# Patient Record
Sex: Female | Born: 1965 | Race: White | Hispanic: No | Marital: Married | Smoking: Never smoker
Health system: Southern US, Community
[De-identification: ages and names within clinical notes are randomized; demographics above are authoritative.]

## PROBLEM LIST (undated history)

## (undated) DIAGNOSIS — I10 Essential (primary) hypertension: Secondary | ICD-10-CM

---

## 2006-10-06 ENCOUNTER — Ambulatory Visit: Payer: Self-pay | Admitting: Obstetrics and Gynecology

## 2008-03-15 ENCOUNTER — Ambulatory Visit: Payer: Self-pay

## 2009-05-09 ENCOUNTER — Ambulatory Visit: Payer: Self-pay

## 2010-05-22 ENCOUNTER — Ambulatory Visit: Payer: Self-pay | Admitting: Family Medicine

## 2011-12-24 ENCOUNTER — Ambulatory Visit: Payer: Self-pay | Admitting: Family Medicine

## 2012-01-03 ENCOUNTER — Ambulatory Visit: Payer: Self-pay | Admitting: Family Medicine

## 2012-07-21 ENCOUNTER — Ambulatory Visit: Payer: Self-pay | Admitting: Family Medicine

## 2015-02-20 ENCOUNTER — Ambulatory Visit
Admission: RE | Admit: 2015-02-20 | Discharge: 2015-02-20 | Disposition: A | Discharge status: Home / Self Care | Payer: BLUE CROSS/BLUE SHIELD | Source: Ambulatory Visit | Attending: Family Medicine | Admitting: Family Medicine

## 2015-02-20 ENCOUNTER — Other Ambulatory Visit: Payer: Self-pay | Admitting: Family Medicine

## 2015-02-20 DIAGNOSIS — X58XXXA Exposure to other specified factors, initial encounter: Secondary | ICD-10-CM | POA: Diagnosis not present

## 2015-02-20 DIAGNOSIS — S93421A Sprain of deltoid ligament of right ankle, initial encounter: Secondary | ICD-10-CM | POA: Diagnosis not present

## 2015-02-20 DIAGNOSIS — S93401A Sprain of unspecified ligament of right ankle, initial encounter: Secondary | ICD-10-CM

## 2015-09-16 ENCOUNTER — Other Ambulatory Visit: Payer: Self-pay | Admitting: Family Medicine

## 2015-09-16 DIAGNOSIS — Z1231 Encounter for screening mammogram for malignant neoplasm of breast: Secondary | ICD-10-CM

## 2015-09-19 ENCOUNTER — Ambulatory Visit
Admission: RE | Admit: 2015-09-19 | Discharge: 2015-09-19 | Disposition: A | Discharge status: Home / Self Care | Payer: BLUE CROSS/BLUE SHIELD | Source: Ambulatory Visit | Attending: Family Medicine | Admitting: Family Medicine

## 2015-09-19 DIAGNOSIS — Z1231 Encounter for screening mammogram for malignant neoplasm of breast: Secondary | ICD-10-CM | POA: Diagnosis present

## 2015-12-09 IMAGING — CR DG ANKLE COMPLETE 3+V*R*
4 series · 4 of 4 positions shown · non-contrast
Comparison: None.

CLINICAL DATA: Injury.  Initial evaluation .

EXAM:
RIGHT ANKLE - COMPLETE 3+ VIEW

[ankle ap]
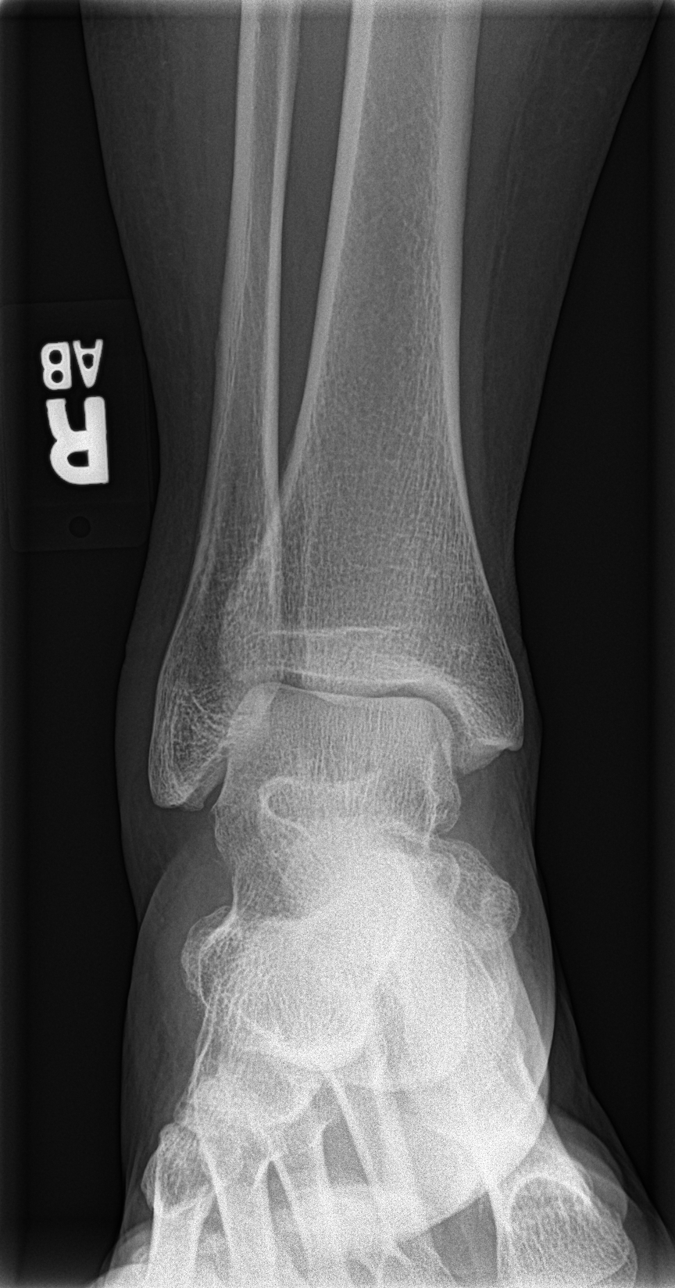

[ankle obl]
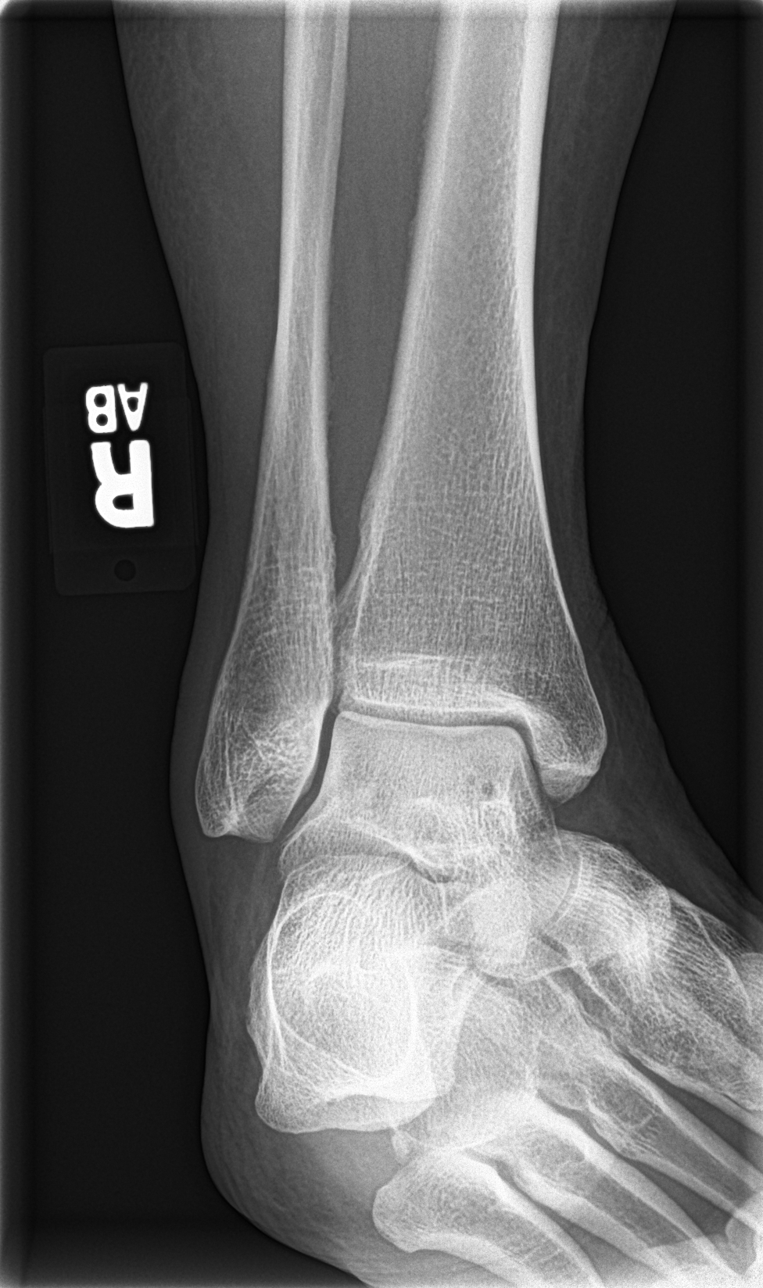

[ankle lat (1 of 2)]
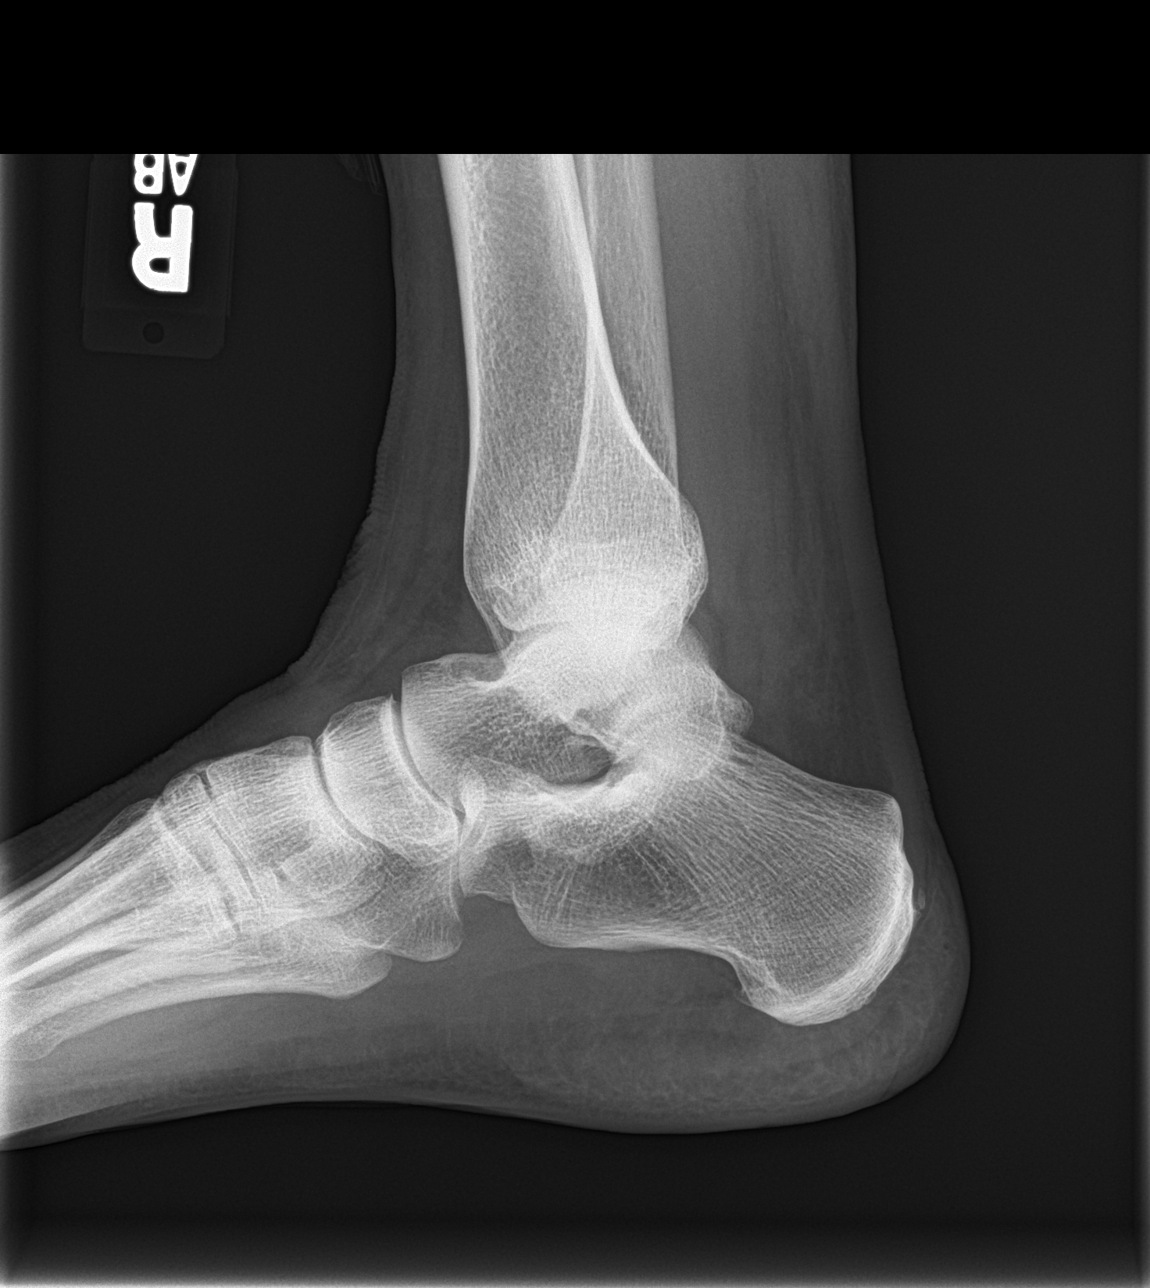

[ankle lat (2 of 2)]
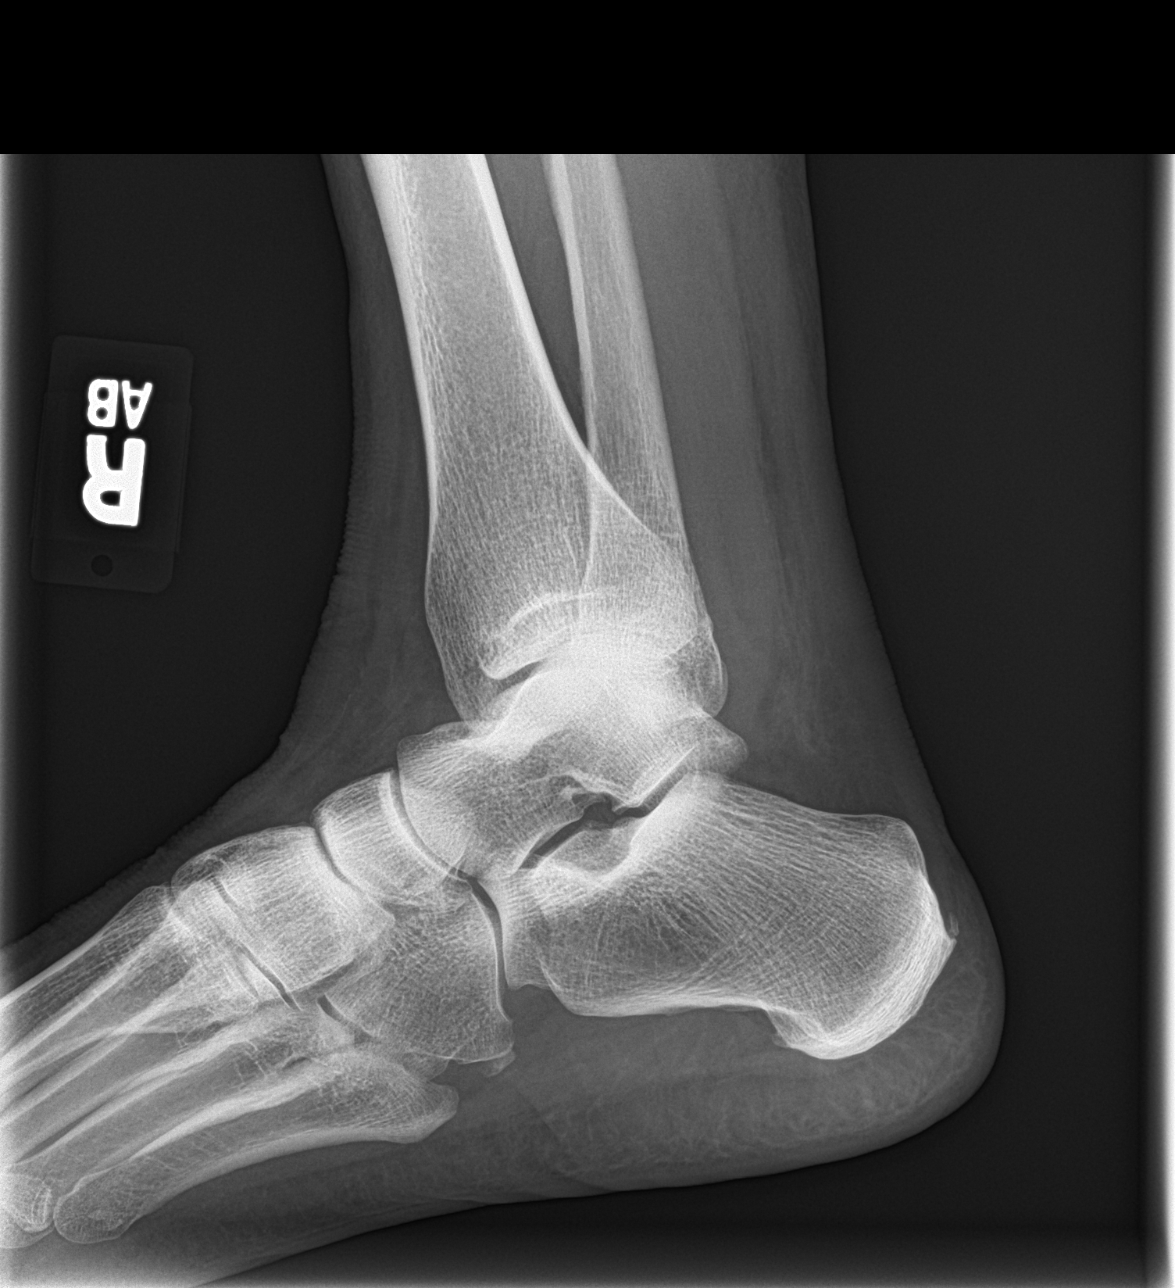

[4 of 4 positions shown; findings below may reference images not displayed]

FINDINGS: Mild soft tissue swelling. No acute bony or joint abnormality. No
evidence of fracture or dislocation.
IMPRESSION: Mild soft tissue swelling.  Otherwise negative exam.

## 2016-03-05 ENCOUNTER — Encounter: Payer: Self-pay | Admitting: Emergency Medicine

## 2016-03-05 ENCOUNTER — Emergency Department
Admission: EM | Admit: 2016-03-05 | Discharge: 2016-03-05 | Disposition: A | Discharge status: Home / Self Care | Payer: PRIVATE HEALTH INSURANCE | Attending: Emergency Medicine | Admitting: Emergency Medicine

## 2016-03-05 DIAGNOSIS — Y9389 Activity, other specified: Secondary | ICD-10-CM | POA: Insufficient documentation

## 2016-03-05 DIAGNOSIS — Y999 Unspecified external cause status: Secondary | ICD-10-CM | POA: Diagnosis not present

## 2016-03-05 DIAGNOSIS — I10 Essential (primary) hypertension: Secondary | ICD-10-CM | POA: Insufficient documentation

## 2016-03-05 DIAGNOSIS — Z79899 Other long term (current) drug therapy: Secondary | ICD-10-CM | POA: Insufficient documentation

## 2016-03-05 DIAGNOSIS — S0101XA Laceration without foreign body of scalp, initial encounter: Secondary | ICD-10-CM | POA: Insufficient documentation

## 2016-03-05 DIAGNOSIS — Y92019 Unspecified place in single-family (private) house as the place of occurrence of the external cause: Secondary | ICD-10-CM | POA: Diagnosis not present

## 2016-03-05 DIAGNOSIS — W228XXA Striking against or struck by other objects, initial encounter: Secondary | ICD-10-CM | POA: Diagnosis not present

## 2016-03-05 HISTORY — DX: Essential (primary) hypertension: I10

## 2016-03-05 MED ORDER — HYDROCODONE-ACETAMINOPHEN 5-325 MG PO TABS: 1.0000 | ORAL_TABLET | ORAL | Status: AC | PRN

## 2016-03-05 MED ORDER — HYDROCODONE-ACETAMINOPHEN 5-325 MG PO TABS
1.0000 | ORAL_TABLET | Freq: Once | ORAL | Status: AC
  Administered 2016-03-05: 1 via ORAL
  Filled 2016-03-05: qty 1

## 2016-03-05 MED ORDER — LIDOCAINE-EPINEPHRINE (PF) 1 %-1:200000 IJ SOLN
10.0000 mL | Freq: Once | INTRAMUSCULAR | Status: DC
  Filled 2016-03-05: qty 30

## 2016-03-05 NOTE — ED Notes (Signed)
See triage note  States clock fell and hit her on top of head

## 2016-03-05 NOTE — ED Notes (Signed)
Pt to ed with c/o laceration to top of head.  Pt reports a clock fell off the mantle at home and hit her in the top of her head.  Pt denies loss of consciousness and is alert and oriented at triage.  Pt denies use of blood thinners.  Bleeding controlled at triage bandage applied.

## 2016-03-05 NOTE — Discharge Instructions (Signed)
Laceration Care, Adult °A laceration is a cut that goes through all of the layers of the skin and into the tissue that is right under the skin. Some lacerations heal on their own. Others need to be closed with stitches (sutures), staples, skin adhesive strips, or skin glue. Proper laceration care minimizes the risk of infection and helps the laceration to heal better. °HOW TO CARE FOR YOUR LACERATION °If sutures or staples were used: °· Keep the wound clean and dry. °· If you were given a bandage (dressing), you should change it at least one time per day or as told by your health care provider. You should also change it if it becomes wet or dirty. °· Keep the wound completely dry for the first 24 hours or as told by your health care provider. After that time, you may shower or bathe. However, make sure that the wound is not soaked in water until after the sutures or staples have been removed. °· Clean the wound one time each day or as told by your health care provider: °· Wash the wound with soap and water. °· Rinse the wound with water to remove all soap. °· Pat the wound dry with a clean towel. Do not rub the wound. °· After cleaning the wound, apply a thin layer of antibiotic ointment as told by your health care provider. This will help to prevent infection and keep the dressing from sticking to the wound. °· Have the sutures or staples removed as told by your health care provider. °If skin adhesive strips were used: °· Keep the wound clean and dry. °· If you were given a bandage (dressing), you should change it at least one time per day or as told by your health care provider. You should also change it if it becomes dirty or wet. °· Do not get the skin adhesive strips wet. You may shower or bathe, but be careful to keep the wound dry. °· If the wound gets wet, pat it dry with a clean towel. Do not rub the wound. °· Skin adhesive strips fall off on their own. You may trim the strips as the wound heals. Do not  remove skin adhesive strips that are still stuck to the wound. They will fall off in time. °If skin glue was used: °· Try to keep the wound dry, but you may briefly wet it in the shower or bath. Do not soak the wound in water, such as by swimming. °· After you have showered or bathed, gently pat the wound dry with a clean towel. Do not rub the wound. °· Do not do any activities that will make you sweat heavily until the skin glue has fallen off on its own. °· Do not apply liquid, cream, or ointment medicine to the wound while the skin glue is in place. Using those may loosen the film before the wound has healed. °· If you were given a bandage (dressing), you should change it at least one time per day or as told by your health care provider. You should also change it if it becomes dirty or wet. °· If a dressing is placed over the wound, be careful not to apply tape directly over the skin glue. Doing that may cause the glue to be pulled off before the wound has healed. °· Do not pick at the glue. The skin glue usually remains in place for 5-10 days, then it falls off of the skin. °General Instructions °· Take over-the-counter and prescription   medicines only as told by your health care provider. °· If you were prescribed an antibiotic medicine or ointment, take or apply it as told by your doctor. Do not stop using it even if your condition improves. °· To help prevent scarring, make sure to cover your wound with sunscreen whenever you are outside after stitches are removed, after adhesive strips are removed, or when glue remains in place and the wound is healed. Make sure to wear a sunscreen of at least 30 SPF. °· Do not scratch or pick at the wound. °· Keep all follow-up visits as told by your health care provider. This is important. °· Check your wound every day for signs of infection. Watch for: °· Redness, swelling, or pain. °· Fluid, blood, or pus. °· Raise (elevate) the injured area above the level of your heart  while you are sitting or lying down, if possible. °SEEK MEDICAL CARE IF: °· You received a tetanus shot and you have swelling, severe pain, redness, or bleeding at the injection site. °· You have a fever. °· A wound that was closed breaks open. °· You notice a bad smell coming from your wound or your dressing. °· You notice something coming out of the wound, such as wood or glass. °· Your pain is not controlled with medicine. °· You have increased redness, swelling, or pain at the site of your wound. °· You have fluid, blood, or pus coming from your wound. °· You notice a change in the color of your skin near your wound. °· You need to change the dressing frequently due to fluid, blood, or pus draining from the wound. °· You develop a new rash. °· You develop numbness around the wound. °SEEK IMMEDIATE MEDICAL CARE IF: °· You develop severe swelling around the wound. °· Your pain suddenly increases and is severe. °· You develop painful lumps near the wound or on skin that is anywhere on your body. °· You have a red streak going away from your wound. °· The wound is on your hand or foot and you cannot properly move a finger or toe. °· The wound is on your hand or foot and you notice that your fingers or toes look pale or bluish. °  °This information is not intended to replace advice given to you by your health care provider. Make sure you discuss any questions you have with your health care provider. °  °Document Released: 09/20/2005 Document Revised: 02/04/2015 Document Reviewed: 09/16/2014 °Elsevier Interactive Patient Education ©2016 [employer] ° °Stitches, Staples, or Adhesive Wound Closure °Health care providers use stitches (sutures), staples, and certain glue (skin adhesives) to hold skin together while it heals (wound closure). You may need this treatment after you have surgery or if you cut your skin accidentally. These methods help your skin to heal more quickly and make it less likely that you will have  a scar. A wound may take several months to heal completely. °The type of wound you have determines when your wound gets closed. In most cases, the wound is closed as soon as possible (primary skin closure). Sometimes, closure is delayed so the wound can be cleaned and allowed to heal naturally. This reduces the chance of infection. Delayed closure may be needed if your wound: °· Is caused by a bite. °· Happened more than 6 hours ago. °· Involves loss of skin or the tissues under the skin. °· Has dirt or debris in it that cannot be removed. °· Is infected. °WHAT   ARE THE DIFFERENT KINDS OF WOUND CLOSURES? °There are many options for wound closure. The one that your health care provider uses depends on how deep and how large your wound is. °Adhesive Glue °To use this type of glue to close a wound, your health care provider holds the edges of the wound together and paints the glue on the surface of your skin. You may need more than one layer of glue. Then the wound may be covered with a light bandage (dressing). °This type of skin closure may be used for small wounds that are not deep (superficial). Using glue for wound closure is less painful than other methods. It does not require a medicine that numbs the area (local anesthetic). This method also leaves nothing to be removed. Adhesive glue is often used for children and on facial wounds. °Adhesive glue cannot be used for wounds that are deep, uneven, or bleeding. It is not used inside of a wound.  °Adhesive Strips °These strips are made of sticky (adhesive), porous paper. They are applied across your skin edges like a regular adhesive bandage. You leave them on until they fall off. °Adhesive strips may be used to close very superficial wounds. They may also be used along with sutures to improve the closure of your skin edges.  °Sutures °Sutures are the oldest method of wound closure. Sutures can be made from natural substances, such as silk, or from synthetic  materials, such as nylon and steel. They can be made from a material that your body can break down as your wound heals (absorbable), or they can be made from a material that needs to be removed from your skin (nonabsorbable). They come in many different strengths and sizes. °Your health care provider attaches the sutures to a steel needle on one end. Sutures can be passed through your skin, or through the tissues beneath your skin. Then they are tied and cut. Your skin edges may be closed in one continuous stitch or in separate stitches. °Sutures are strong and can be used for all kinds of wounds. Absorbable sutures may be used to close tissues under the skin. The disadvantage of sutures is that they may cause skin reactions that lead to infection. Nonabsorbable sutures need to be removed. °Staples °When surgical staples are used to close a wound, the edges of your skin on both sides of the wound are brought close together. A staple is placed across the wound, and an instrument secures the edges together. Staples are often used to close surgical cuts (incisions). °Staples are faster to use than sutures, and they cause less skin reaction. Staples need to be removed using a tool that bends the staples away from your skin. °HOW DO I CARE FOR MY WOUND CLOSURE? °· Take medicines only as directed by your health care provider. °· If you were prescribed an antibiotic medicine for your wound, finish it all even if you start to feel better. °· Use ointments or creams only as directed by your health care provider. °· Wash your hands with soap and water before and after touching your wound. °· Do not soak your wound in water. Do not take baths, swim, or use a hot tub until your health care provider approves. °· Ask your health care provider when you can start showering. Cover your wound if directed by your health care provider. °· Do not take out your own sutures or staples. °· Do not pick at your wound. Picking can cause an  infection. °·   Keep all follow-up visits as directed by your health care provider. This is important. °HOW LONG WILL I HAVE MY WOUND CLOSURE? °· Leave adhesive glue on your skin until the glue peels away. °· Leave adhesive strips on your skin until the strips fall off. °· Absorbable sutures will dissolve within several days. °· Nonabsorbable sutures and staples must be removed. The location of the wound will determine how long they stay in. This can range from several days to a couple of weeks. °WHEN SHOULD I SEEK HELP FOR MY WOUND CLOSURE? °Contact your health care provider if: °· You have a fever. °· You have chills. °· You have drainage, redness, swelling, or pain at your wound. °· There is a bad smell coming from your wound. °· The skin edges of your wound start to separate after your sutures have been removed. °· Your wound becomes thick, raised, and darker in color after your sutures come out (scarring). °  °This information is not intended to replace advice given to you by your health care provider. Make sure you discuss any questions you have with your health care provider. °  °Document Released: 06/15/2001 Document Revised: 10/11/2014 Document Reviewed: 02/27/2014 °Elsevier Interactive Patient Education ©2016 [employer] ° °

## 2016-03-05 NOTE — ED Provider Notes (Signed)
[facility] Emergency Department Provider Note  ____________________________________________  Time seen: Approximately 2:45 PM  I have reviewed the triage vital signs and the nursing notes.   HISTORY  Chief Complaint Head Laceration    HPI Shannon Meyers is a 50 y.o. female who presents to emergency department complaining of a laceration to the right frontal scalp region. Patient states that she closed a cabinet door and a clock on the top shelf fell and struck her in the head. Patient did not lose consciousness at any time. She does not use blood thinners. Patient states that she was able control bleeding with ice pack and towel. Patient states that area is very tender to touch. No headache, visual acuity changes, neck pain, nausea or vomiting.   Past Medical History  Diagnosis Date  . Hypertension     There are no active problems to display for this patient.   History reviewed. No pertinent past surgical history.  Current Outpatient Rx  Name  Route  Sig  Dispense  Refill  . lisinopril (PRINIVIL,ZESTRIL) 20 MG tablet   Oral   Take 20 mg by mouth daily.         Marland Kitchen HYDROcodone-acetaminophen (NORCO/VICODIN) 5-325 MG tablet   Oral   Take 1 tablet by mouth every 4 (four) hours as needed for moderate pain.   8 tablet   0     Allergies Sudafed  Family History  Problem Relation Age of Onset  . Breast cancer Paternal Aunt   . Breast cancer Cousin     Social History Social History  Substance Use Topics  . Smoking status: Never Smoker   . Smokeless tobacco: None  . Alcohol Use: No     Review of Systems  Constitutional: No fever/chills Eyes: No visual changes. Cardiovascular: no chest pain. Respiratory: no cough. No SOB. Musculoskeletal: Negative for musculoskeletal pain. Skin: Negative for rash, abrasions, ecchymosis.Positive for laceration to scalp. Neurological: Negative for headaches, focal weakness or numbness. 10-point ROS  otherwise negative.  ____________________________________________   PHYSICAL EXAM:  VITAL SIGNS: ED Triage Vitals  Enc Vitals Group     BP 03/05/16 1420 153/104 mmHg     Pulse Rate 03/05/16 1420 93     Resp 03/05/16 1420 20     Temp --      Temp src --      SpO2 03/05/16 1420 97 %     Weight 03/05/16 1420 205 lb (92.987 kg)     Height 03/05/16 1420  (1.727 m)     Head Cir --      Peak Flow --      Pain Score 03/05/16 1421 8     Pain Loc --      Pain Edu? --      Excl. in GC? --      Constitutional: Alert and oriented. Well appearing and in no acute distress. Eyes: Conjunctivae are normal. PERRL. EOMI. Head: Laceration is noted to the right frontal scalp region. This is approximately 2 cm in length. No bleeding at this time. No visible foreign body. Area is tender to palpation. No other tenderness to palpation of the bony landmarks of the skull. No crepitus to palpation. No battle signs, raccoon eyes, serosanguineous fluid drainage from the ears or nares, signs of epistaxis. Bleeding is controlled at this time. Neck: No stridor.  No cervical spine tenderness to palpation.  Cardiovascular: Normal rate, regular rhythm. Normal S1 and S2.  Good peripheral circulation. Respiratory: Normal respiratory  effort without tachypnea or retractions. Lungs CTAB. Good air entry to the bases with no decreased or absent breath sounds. Musculoskeletal: Full range of motion to all extremities. No gross deformities appreciated. Neurologic:  Normal speech and language. No gross focal neurologic deficits are appreciated. Cranial nerves II through XII are grossly intact. Skin:  Skin is warm, dry and intact. No rash noted. Psychiatric: Mood and affect are normal. Speech and behavior are normal. Patient exhibits appropriate insight and judgement.   ____________________________________________   LABS (all labs ordered are listed, but only abnormal results are displayed)  Labs Reviewed - No data  to display ____________________________________________  EKG   ____________________________________________  RADIOLOGY   No results found.  ____________________________________________    PROCEDURES  Procedure(s) performed:    LACERATION REPAIR Performed by: Racheal PatchesJonathan D Jensyn Shave Authorized by: Delorise RoyalsJonathan D Tawanna Funk Consent: Verbal consent obtained. Risks and benefits: risks, benefits and alternatives were discussed Consent given by: patient Patient identity confirmed: provided demographic data Prepped and Draped in normal sterile fashion Wound explored  Laceration Location: Right frontal region of the scalp  Laceration Length: 2 cm  No Foreign Bodies seen or palpated  Anesthesia: local infiltration  Local anesthetic: lidocaine 1 % with epinephrine  Anesthetic total: 4 ml  Irrigation method: syringe Amount of cleaning: standard  Skin closure: Staples   Number of sutures: 2   Technique: Edges were well approximated and closed using 2 staples.   Patient tolerance: Patient tolerated the procedure well with no immediate complications.    Medications  lidocaine-EPINEPHrine (XYLOCAINE-EPINEPHrine) 1 %-1:200000 (PF) injection 10 mL (not administered)  HYDROcodone-acetaminophen (NORCO/VICODIN) 5-325 MG per tablet 1 tablet (not administered)     ____________________________________________   INITIAL IMPRESSION / ASSESSMENT AND PLAN / ED COURSE  Pertinent labs & imaging results that were available during my care of the patient were reviewed by me and considered in my medical decision making (see chart for details).  Patient's diagnosis is consistent with scalp laceration with accompanying headache. Patient's laceration is closed as described above. Patient's exam is reassuring and no imaging is ordered at this time. Patient is given medication for pain. Return instructions and instructed to follow up with primary care or urgent care in 1 week for staple  removal.. Patient will be discharged home with prescriptions for limited pain medication.Patient is given ED precautions to return to the ED for any worsening or new symptoms.     ____________________________________________  FINAL CLINICAL IMPRESSION(S) / ED DIAGNOSES  Final diagnoses:  Scalp laceration, initial encounter      NEW MEDICATIONS STARTED DURING THIS VISIT:  New Prescriptions   HYDROCODONE-ACETAMINOPHEN (NORCO/VICODIN) 5-325 MG TABLET    Take 1 tablet by mouth every 4 (four) hours as needed for moderate pain.        This chart was dictated using voice recognition software/Dragon. Despite best efforts to proofread, errors can occur which can change the meaning. Any change was purely unintentional.    Racheal PatchesJonathan D Kynadee Dam, PA-C 03/05/16 1519  Sharman CheekPhillip Stafford, MD 03/05/16 [phone]
